# Patient Record
Sex: Male | Born: 1990 | Race: White | Hispanic: No | Smoking: Never smoker
Health system: Southern US, Community
[De-identification: ages and names within clinical notes are randomized; demographics above are authoritative.]

---

## 2010-06-13 ENCOUNTER — Emergency Department: Payer: Self-pay | Admitting: Emergency Medicine

## 2010-10-09 ENCOUNTER — Emergency Department: Payer: Self-pay | Admitting: Emergency Medicine

## 2010-10-11 ENCOUNTER — Emergency Department: Payer: Self-pay | Admitting: Emergency Medicine

## 2012-01-26 ENCOUNTER — Emergency Department: Payer: Self-pay | Admitting: Emergency Medicine

## 2015-03-09 ENCOUNTER — Emergency Department: Payer: Self-pay | Admitting: Emergency Medicine

## 2015-11-13 ENCOUNTER — Encounter: Payer: Self-pay | Admitting: Urgent Care

## 2015-11-13 ENCOUNTER — Emergency Department: Payer: Self-pay

## 2015-11-13 ENCOUNTER — Emergency Department
Admission: EM | Admit: 2015-11-13 | Discharge: 2015-11-13 | Disposition: A | Discharge status: Home / Self Care | Payer: Self-pay | Attending: Emergency Medicine | Admitting: Emergency Medicine

## 2015-11-13 DIAGNOSIS — J320 Chronic maxillary sinusitis: Secondary | ICD-10-CM | POA: Insufficient documentation

## 2015-11-13 DIAGNOSIS — I889 Nonspecific lymphadenitis, unspecified: Secondary | ICD-10-CM | POA: Insufficient documentation

## 2015-11-13 LAB — CBC
HCT: 45.7 % (ref 40.0–52.0)
Hemoglobin: 14.9 g/dL (ref 13.0–18.0)
MCH: 28 pg (ref 26.0–34.0)
MCHC: 32.7 g/dL (ref 32.0–36.0)
MCV: 85.8 fL (ref 80.0–100.0)
PLATELETS: 302 10*3/uL (ref 150–440)
RBC: 5.33 MIL/uL (ref 4.40–5.90)
RDW: 13.6 % (ref 11.5–14.5)
WBC: 9.7 10*3/uL (ref 3.8–10.6)

## 2015-11-13 LAB — BASIC METABOLIC PANEL
ANION GAP: 7 (ref 5–15)
BUN: 14 mg/dL (ref 6–20)
CALCIUM: 9.4 mg/dL (ref 8.9–10.3)
CO2: 28 mmol/L (ref 22–32)
Chloride: 105 mmol/L (ref 101–111)
Creatinine, Ser: 0.98 mg/dL (ref 0.61–1.24)
Glucose, Bld: 123 mg/dL — ABNORMAL HIGH (ref 65–99)
Potassium: 3.8 mmol/L (ref 3.5–5.1)
Sodium: 140 mmol/L (ref 135–145)

## 2015-11-13 MED ORDER — AMOXICILLIN 875 MG PO TABS: 875.0000 mg | ORAL_TABLET | Freq: Two times a day (BID) | ORAL | Status: AC

## 2015-11-13 MED ORDER — IOHEXOL 300 MG/ML  SOLN
75.0000 mL | Freq: Once | INTRAMUSCULAR | Status: AC | PRN
  Administered 2015-11-13: 75 mL via INTRAVENOUS
  Filled 2015-11-13: qty 75

## 2015-11-13 MED ORDER — AMOXICILLIN-POT CLAVULANATE 875-125 MG PO TABS
1.0000 | ORAL_TABLET | Freq: Once | ORAL | Status: AC
  Administered 2015-11-13: 1 via ORAL
  Filled 2015-11-13: qty 1

## 2015-11-13 NOTE — ED Provider Notes (Signed)
CSN: [id number]     Arrival date & time 11/13/15  2033 History   First MD Initiated Contact with Patient 11/13/15 2113     Chief Complaint  Patient presents with  . Hair/Scalp Problem     (Consider location/radiation/quality/duration/timing/severity/associated sxs/prior Treatment) HPI  24 year old male presents to respond for evaluation of soft tissue swelling in the posterior occipital region on the right side of the scalp. Swelling was noticed earlier today after a haircut. He does have some tenderness to palpation but without palpation there is no discomfort. He denies any fevers cough congestion runny nose. Patient is concerned about cancer due to family history of neck cancer. Pain is 6 out of 10 to touch. He denies any warmth erythema. No drainage. He is not taking any medications for pain.  History reviewed. No pertinent past medical history. History reviewed. No pertinent past surgical history. No family history on file. Social History  Substance Use Topics  . Smoking status: Never Smoker   . Smokeless tobacco: None  . Alcohol Use: No    Review of Systems  Constitutional: Negative.  Negative for fever, chills, activity change and appetite change.  HENT: Negative for congestion, ear pain, mouth sores, rhinorrhea, sinus pressure, sore throat and trouble swallowing.   Eyes: Negative for photophobia, pain and discharge.  Respiratory: Negative for cough, chest tightness and shortness of breath.   Cardiovascular: Negative for chest pain and leg swelling.  Gastrointestinal: Negative for nausea, vomiting, abdominal pain, diarrhea and abdominal distention.  Genitourinary: Negative for dysuria and difficulty urinating.  Musculoskeletal: Negative for back pain, arthralgias and gait problem.  Skin: Positive for wound (soft tissue swelling posterior occipital). Negative for color change.  Neurological: Negative for dizziness and headaches.  Hematological: Negative for adenopathy.   Psychiatric/Behavioral: Negative for behavioral problems and agitation.      Allergies  Review of patient's allergies indicates no known allergies.  Home Medications   Prior to Admission medications   Medication Sig Start Date End Date Taking? Authorizing Provider  amoxicillin (AMOXIL) 875 MG tablet Take 1 tablet (875 mg total) by mouth 2 (two) times daily. X 10 days 11/13/15   Evon Slackhomas C Omero Kowal, PA-C   BP 154/77 mmHg  Pulse 95  Temp(Src) 98.6 F (37 C) (Oral)  Resp 18  Ht 5\' 10"  (1.778 m)  Wt 111.131 kg  BMI 35.15 kg/m2  SpO2 96% Physical Exam  Constitutional: He is oriented to person, place, and time. He appears well-developed and well-nourished.  HENT:  Head: Normocephalic and atraumatic.  Right Ear: External ear normal.  Left Ear: External ear normal.  Nose: Nose normal.  Mouth/Throat: Oropharynx is clear and moist. No oropharyngeal exudate.  Eyes: Conjunctivae and EOM are normal. Pupils are equal, round, and reactive to light.  Neck: Normal range of motion. Neck supple.  Cardiovascular: Normal rate, regular rhythm, normal heart sounds and intact distal pulses.   Pulmonary/Chest: Effort normal and breath sounds normal. No respiratory distress. He has no wheezes. He has no rales. He exhibits no tenderness.  Abdominal: Soft. Bowel sounds are normal. He exhibits no distension. There is no tenderness.  Musculoskeletal: Normal range of motion. He exhibits no edema or tenderness.  Lymphadenopathy:    He has no cervical adenopathy.  Neurological: He is alert and oriented to person, place, and time.  Skin: Skin is warm and dry.  Right Posterior occipital region shows a area of soft tissue swelling that is 5 cm in diameter. Area appears to be fluctuant. Area is tender  to palpation. No warmth, erythema, drainage. patient with full range of motion cervical spine with no discomfort. No palpable lymph nodes.   Psychiatric: He has a normal mood and affect. His behavior is normal.  Judgment and thought content normal.    ED Course  Procedures (including critical care time) Labs Review Labs Reviewed  BASIC METABOLIC PANEL - Abnormal; Notable for the following:    Glucose, Bld 123 (*)    All other components within normal limits  CBC    Imaging Review Ct Soft Tissue Neck W Contrast  11/13/2015  CLINICAL DATA:  Right occipital scalp swelling with tenderness. EXAM: CT NECK WITH CONTRAST TECHNIQUE: Multidetector CT imaging of the neck was performed using the standard protocol following the bolus administration of intravenous contrast. CONTRAST:  75mL OMNIPAQUE IOHEXOL 300 MG/ML  SOLN COMPARISON:  None. FINDINGS: Corresponding with the area of concern, there is some localize subcutaneous stranding and potentially a 5 mm in short axis lymph node on image 21 series 2 in the right occipital subcutaneous tissues. There other scattered lymph nodes in the neck including a 9 mm right station 2 lymph node and an 8 mm left station 2 lymph node, as well as small bilateral submandibular, submental, and station 3 and station 4 lymph nodes. Mild chronic right maxillary sinusitis. Tooth decay observed with various dental cavities and lucency along a presumably recently extracted right medial mandibular molar, for example on image 34 series 2. I do not see a definite abscess adjacent to the mandible in this vicinity. IMPRESSION: 1. Scattered lymph nodes in the neck are probably reactive. Corresponding to the palpable abnormality, there is a small potentially inflamed lymph node in the right occipital subcutaneous tissues with some adjacent stranding. 2. Tooth decay with several dental cavities observed. Electronically Signed   By: Gaylyn Rong M.D.   On: 11/13/2015 22:09   I have personally reviewed and evaluated these images and lab results as part of my medical decision-making.   EKG Interpretation None      MDM   Final diagnoses:  Chronic maxillary sinusitis  Lymphadenitis     24 year old male with soft tissue swelling to the right posterior occipital region. Patient was concerned due to similar presentation being diagnosed as cancer and patient's mother. CT scan showed occipital lymphadenopathy and area of soft tissue swelling. He also had chronic maxillary sinusitis with tooth decay. Patient is placed on Augmentin 875-125, one tab by mouth twice a day 10 days. He will follow-up with ENT if no improvement.    Evon Slack, PA-C 11/13/15 2309  Arnaldo Natal, MD 11/14/15 [phone]

## 2015-11-13 NOTE — ED Notes (Signed)
Patient presents with an area of swelling noted to his RIGHT occipital scalp that he first appreciated today following a haircut. Patient reports that are is tender to touch. Denies fever. (+) non-specific headache x 2 days.

## 2015-11-13 NOTE — Discharge Instructions (Signed)
Lymphadenopathy Lymphadenopathy refers to swollen or enlarged lymph glands, also called lymph nodes. Lymph glands are part of your body's defense (immune) system, which protects the body from infections, germs, and diseases. Lymph glands are found in many locations in your body, including the neck, underarm, and groin.  Many things can cause lymph glands to become enlarged. When your immune system responds to germs, such as viruses or bacteria, infection-fighting cells and fluid build up. This causes the glands to grow in size. Usually, this is not something to worry about. The swelling and any soreness often go away without treatment. However, swollen lymph glands can also be caused by a number of diseases. Your health care provider may do various tests to help determine the cause. If the cause of your swollen lymph glands cannot be found, it is important to monitor your condition to make sure the swelling goes away. HOME CARE INSTRUCTIONS Watch your condition for any changes. The following actions may help to lessen any discomfort you are feeling:  Get plenty of rest.  Take medicines only as directed by your health care provider. Your health care provider may recommend over-the-counter medicines for pain.  Apply moist heat compresses to the site of swollen lymph nodes as directed by your health care provider. This can help reduce any pain.  Check your lymph nodes daily for any changes.  Keep all follow-up visits as directed by your health care provider. This is important. SEEK MEDICAL CARE IF:  Your lymph nodes are still swollen after 2 weeks.  Your swelling increases or spreads to other areas.  Your lymph nodes are hard, seem fixed to the skin, or are growing rapidly.  Your skin over the lymph nodes is red and inflamed.  You have a fever.  You have chills.  You have fatigue.  You develop a sore throat.  You have abdominal pain.  You have weight loss.  You have night  sweats. SEEK IMMEDIATE MEDICAL CARE IF:  You notice fluid leaking from the area of the enlarged lymph node.  You have severe pain in any area of your body.  You have chest pain.  You have shortness of breath.   This information is not intended to replace advice given to you by your health care provider. Make sure you discuss any questions you have with your health care provider.   Document Released: 09/16/2008 Document Revised: 12/29/2014 Document Reviewed: 07/13/2014 Elsevier Interactive Patient Education 2016 [employer]  Sinusitis, Adult Sinusitis is redness, soreness, and puffiness (inflammation) of the air pockets in the bones of your face (sinuses). The redness, soreness, and puffiness can cause air and mucus to get trapped in your sinuses. This can allow germs to grow and cause an infection.  HOME CARE   Drink enough fluids to keep your pee (urine) clear or pale yellow.  Use a humidifier in your home.  Run a hot shower to create steam in the bathroom. Sit in the bathroom with the door closed. Breathe in the steam 3-4 times a day.  Put a warm, moist washcloth on your face 3-4 times a day, or as told by your doctor.  Use salt water sprays (saline sprays) to wet the thick fluid in your nose. This can help the sinuses drain.  Only take medicine as told by your doctor. GET HELP RIGHT AWAY IF:   Your pain gets worse.  You have very bad headaches.  You are sick to your stomach (nauseous).  You throw up (vomit).  You are very sleepy (drowsy) all the time.  Your face is puffy (swollen).  Your vision changes.  You have a stiff neck.  You have trouble breathing. MAKE SURE YOU:   Understand these instructions.  Will watch your condition.  Will get help right away if you are not doing well or get worse.   This information is not intended to replace advice given to you by your health care provider. Make sure you discuss any questions you have with your health  care provider.   Document Released: 05/26/2008 Document Revised: 12/29/2014 Document Reviewed: 07/13/2012 Elsevier Interactive Patient Education [employer]

## 2015-11-13 NOTE — ED Notes (Signed)
PA elected to work patient up for presenting c/o. Orders to be placed for PIV, labs, and CT scan. Acuity of patient changed to ESI 3 at this time.

## 2015-11-13 NOTE — ED Notes (Signed)
Patient transported to CT 

## 2017-07-12 IMAGING — CT CT NECK W/ CM
2 of 3 series · 8 of 14 positions shown, 9 images · IV contrast (omnipaque)
Comparison: None.

CLINICAL DATA: Right occipital scalp swelling with tenderness.

EXAM:
CT NECK WITH CONTRAST
TECHNIQUE: Multidetector CT imaging of the neck was performed using the
standard protocol following the bolus administration of intravenous
contrast.
CONTRAST:  75mL OMNIPAQUE IOHEXOL 300 MG/ML  SOLN

[Series 2: axial neck · axial · 0.57mm/px · z∈[-12,+156]mm · 4 of 141 slices shown]
[im 29/141  bone]
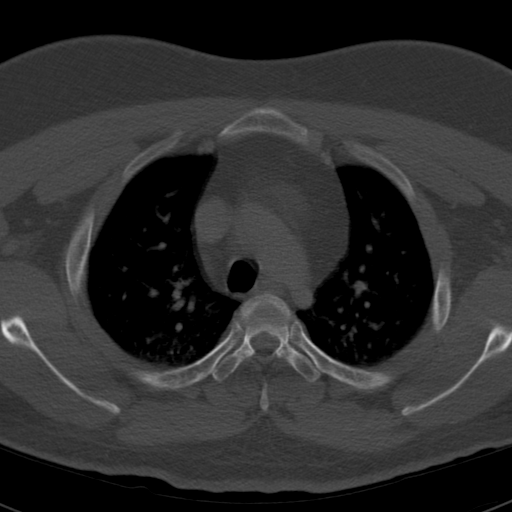
[im 57/141  bone]
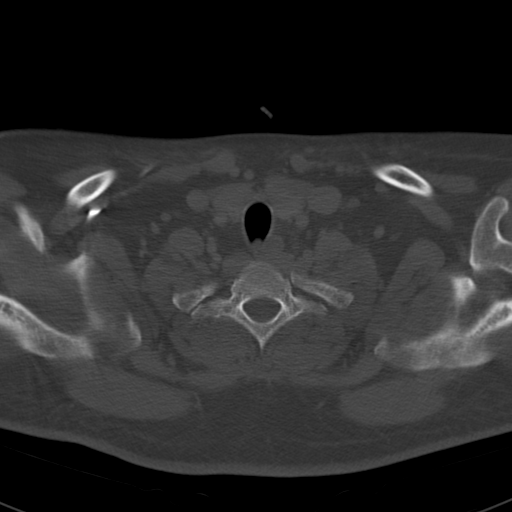
[im 85/141  bone]
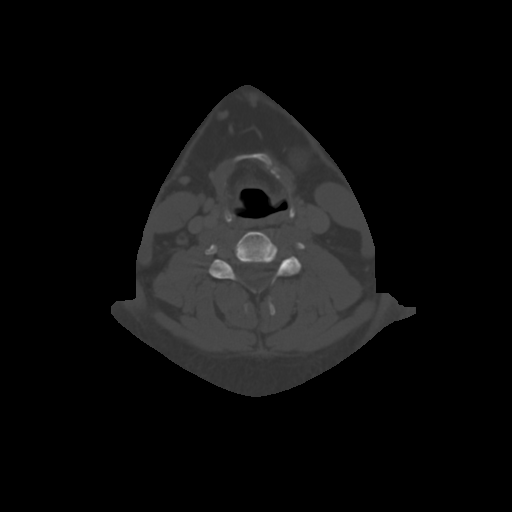
[im 113/141  bone]
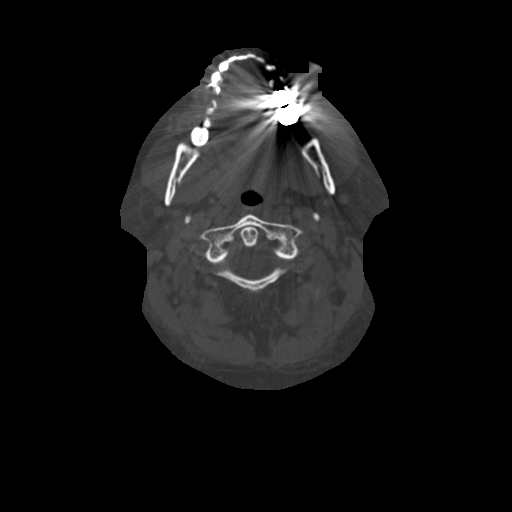

[Series 7: ax oropharynx · axial · 0.55mm/px · z∈[-41,+127]mm · 4 of 144 slices shown, 5 images]
[im 29/144  soft-tissue]
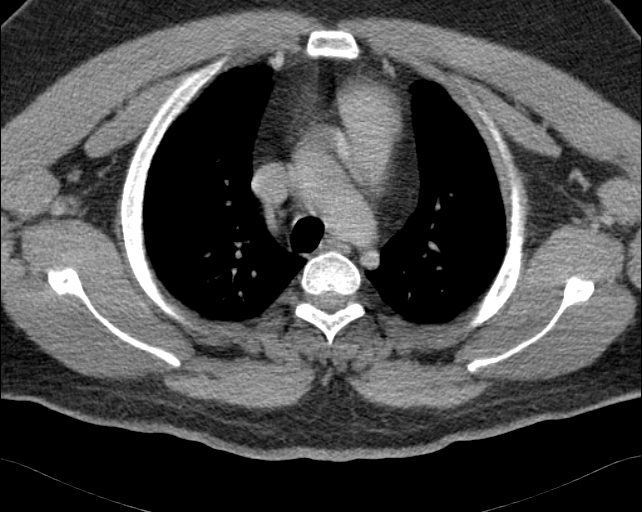
[im 29/144  bone]
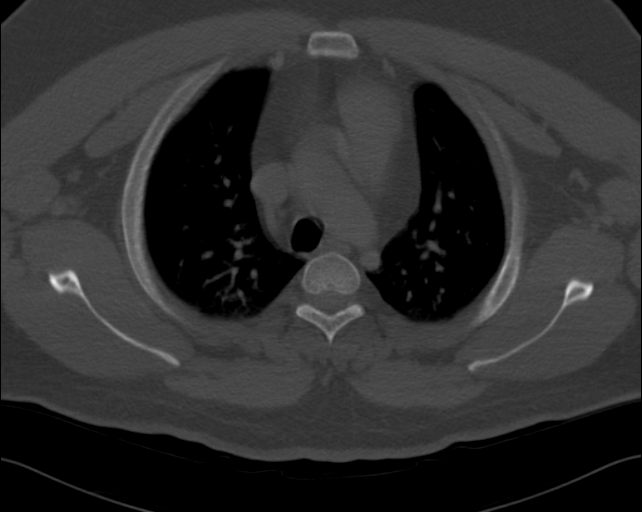
[im 58/144  bone]
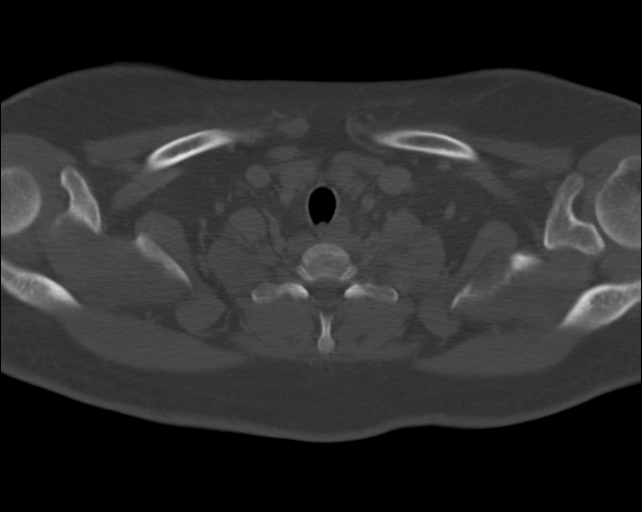
[im 86/144  bone]
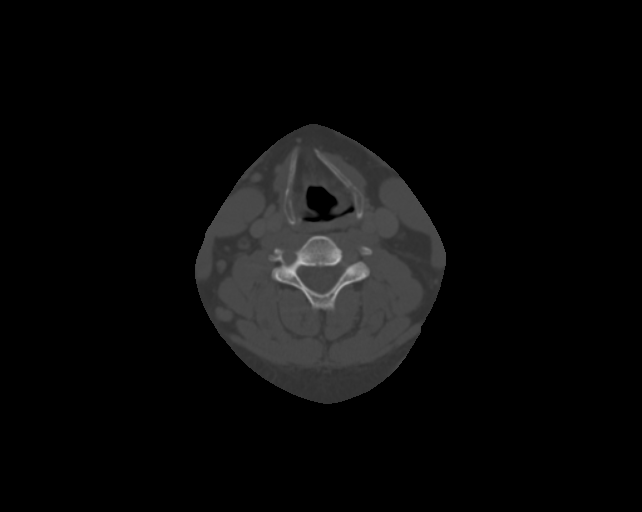
[im 115/144  bone]
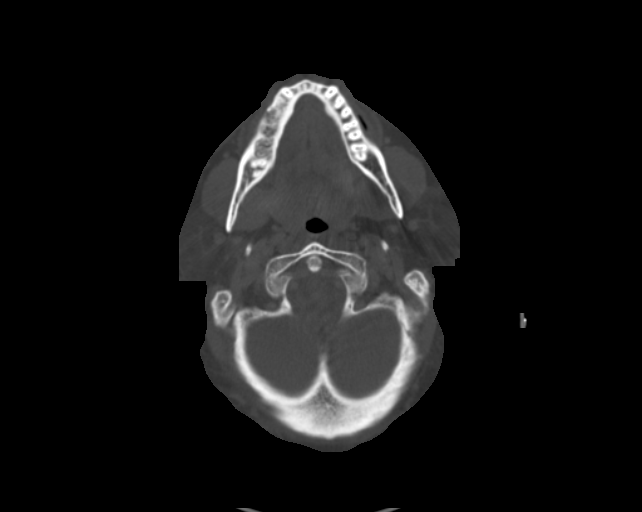

[8 of 14 positions shown; findings below may reference images not displayed]

FINDINGS: Corresponding with the area of concern, there is some localize
subcutaneous stranding and potentially a 5 mm in short axis lymph
node on image 21 series 2 in the right occipital subcutaneous
tissues. There other scattered lymph nodes in the neck including a 9
mm right station 2 lymph node and an 8 mm left station 2 lymph node,
as well as small bilateral submandibular, submental, and station 3
and station 4 lymph nodes.

Mild chronic right maxillary sinusitis. Tooth decay observed with
various dental cavities and lucency along a presumably recently
extracted right medial mandibular molar, for example on image 34
series 2. I do not see a definite abscess adjacent to the mandible
in this vicinity.
IMPRESSION: 1. Scattered lymph nodes in the neck are probably reactive.
Corresponding to the palpable abnormality, there is a small
potentially inflamed lymph node in the right occipital subcutaneous
tissues with some adjacent stranding.
2. Tooth decay with several dental cavities observed.

## 2020-06-05 ENCOUNTER — Ambulatory Visit: Payer: Self-pay | Attending: Internal Medicine
